# Patient Record
Sex: Male | Born: 1950
Health system: Southern US, Community
[De-identification: ages and names within clinical notes are randomized; demographics above are authoritative.]

## PROBLEM LIST (undated history)

## (undated) DIAGNOSIS — K8020 Calculus of gallbladder without cholecystitis without obstruction: Secondary | ICD-10-CM

## (undated) DIAGNOSIS — I10 Essential (primary) hypertension: Secondary | ICD-10-CM

## (undated) HISTORY — PX: APPENDECTOMY: SHX54

## (undated) HISTORY — PX: NECK SURGERY: SHX720

---

## 2017-07-26 ENCOUNTER — Emergency Department (HOSPITAL_BASED_OUTPATIENT_CLINIC_OR_DEPARTMENT_OTHER): Payer: No Typology Code available for payment source

## 2017-07-26 ENCOUNTER — Encounter (HOSPITAL_BASED_OUTPATIENT_CLINIC_OR_DEPARTMENT_OTHER): Payer: Self-pay | Admitting: Emergency Medicine

## 2017-07-26 ENCOUNTER — Emergency Department (HOSPITAL_BASED_OUTPATIENT_CLINIC_OR_DEPARTMENT_OTHER)
Admission: EM | Admit: 2017-07-26 | Discharge: 2017-07-26 | Disposition: A | Discharge status: Home / Self Care | Payer: No Typology Code available for payment source | Attending: Emergency Medicine | Admitting: Emergency Medicine

## 2017-07-26 ENCOUNTER — Other Ambulatory Visit: Payer: Self-pay

## 2017-07-26 DIAGNOSIS — Z79899 Other long term (current) drug therapy: Secondary | ICD-10-CM | POA: Diagnosis not present

## 2017-07-26 DIAGNOSIS — Y939 Activity, unspecified: Secondary | ICD-10-CM | POA: Diagnosis not present

## 2017-07-26 DIAGNOSIS — Y999 Unspecified external cause status: Secondary | ICD-10-CM | POA: Diagnosis not present

## 2017-07-26 DIAGNOSIS — I10 Essential (primary) hypertension: Secondary | ICD-10-CM | POA: Diagnosis not present

## 2017-07-26 DIAGNOSIS — Z7982 Long term (current) use of aspirin: Secondary | ICD-10-CM | POA: Diagnosis not present

## 2017-07-26 DIAGNOSIS — S2242XA Multiple fractures of ribs, left side, initial encounter for closed fracture: Secondary | ICD-10-CM | POA: Insufficient documentation

## 2017-07-26 DIAGNOSIS — Y92410 Unspecified street and highway as the place of occurrence of the external cause: Secondary | ICD-10-CM | POA: Insufficient documentation

## 2017-07-26 DIAGNOSIS — R1030 Lower abdominal pain, unspecified: Secondary | ICD-10-CM | POA: Diagnosis not present

## 2017-07-26 DIAGNOSIS — S299XXA Unspecified injury of thorax, initial encounter: Secondary | ICD-10-CM | POA: Diagnosis present

## 2017-07-26 HISTORY — DX: Essential (primary) hypertension: I10

## 2017-07-26 LAB — BASIC METABOLIC PANEL
Anion gap: 9 (ref 5–15)
BUN: 23 mg/dL — AB (ref 6–20)
CHLORIDE: 103 mmol/L (ref 101–111)
CO2: 21 mmol/L — ABNORMAL LOW (ref 22–32)
Calcium: 9 mg/dL (ref 8.9–10.3)
Creatinine, Ser: 0.92 mg/dL (ref 0.61–1.24)
GFR calc Af Amer: >60 mL/min (ref >60)
GFR calc non Af Amer: >60 mL/min (ref >60)
GLUCOSE: 139 mg/dL — AB (ref 65–99)
POTASSIUM: 3.9 mmol/L (ref 3.5–5.1)
Sodium: 133 mmol/L — ABNORMAL LOW (ref 135–145)

## 2017-07-26 MED ORDER — HYDROCODONE-ACETAMINOPHEN 5-325 MG PO TABS: 1.0000 | ORAL_TABLET | Freq: Four times a day (QID) | ORAL | 0 refills | Status: DC | PRN

## 2017-07-26 MED ORDER — IOPAMIDOL (ISOVUE-300) INJECTION 61%
100.0000 mL | Freq: Once | INTRAVENOUS | Status: AC | PRN
  Administered 2017-07-26: 100 mL via INTRAVENOUS

## 2017-07-26 MED FILL — HYDROCODON-APAP 5-325: 5-325 | 2 days supply | Qty: 10 | Fill #0

## 2017-07-26 NOTE — Discharge Instructions (Signed)
It was my pleasure taking care of you today!   Use incentive spirometer 2-3 times daily to help prevent pneumonia.   You your ibuprofen home therapy.  Pain medication only as needed for severe pain.  Ice to the affected area can help alleviate pain as well.  Follow-up with your primary care doctor in the next week or 2.  Return to ER for new or worsening symptoms, any additional concerns.
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
## 2017-07-26 NOTE — ED Notes (Signed)
Patient is ambulatory back from x-ray
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
## 2017-07-26 NOTE — ED Notes (Signed)
Patient ambulatory to XR/CT
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
## 2021-07-13 ENCOUNTER — Other Ambulatory Visit: Payer: Self-pay

## 2021-07-13 ENCOUNTER — Emergency Department (HOSPITAL_BASED_OUTPATIENT_CLINIC_OR_DEPARTMENT_OTHER)
Admission: EM | Admit: 2021-07-13 | Discharge: 2021-07-14 | Disposition: A | Discharge status: Home / Self Care | Payer: Medicare HMO | Attending: Emergency Medicine | Admitting: Emergency Medicine

## 2021-07-13 ENCOUNTER — Encounter (HOSPITAL_BASED_OUTPATIENT_CLINIC_OR_DEPARTMENT_OTHER): Payer: Self-pay

## 2021-07-13 DIAGNOSIS — I10 Essential (primary) hypertension: Secondary | ICD-10-CM | POA: Insufficient documentation

## 2021-07-13 DIAGNOSIS — Z7982 Long term (current) use of aspirin: Secondary | ICD-10-CM | POA: Diagnosis not present

## 2021-07-13 DIAGNOSIS — E876 Hypokalemia: Secondary | ICD-10-CM | POA: Diagnosis not present

## 2021-07-13 DIAGNOSIS — E860 Dehydration: Secondary | ICD-10-CM | POA: Diagnosis not present

## 2021-07-13 DIAGNOSIS — A084 Viral intestinal infection, unspecified: Secondary | ICD-10-CM | POA: Diagnosis not present

## 2021-07-13 DIAGNOSIS — Z79899 Other long term (current) drug therapy: Secondary | ICD-10-CM | POA: Insufficient documentation

## 2021-07-13 DIAGNOSIS — Z87891 Personal history of nicotine dependence: Secondary | ICD-10-CM | POA: Diagnosis not present

## 2021-07-13 DIAGNOSIS — R1031 Right lower quadrant pain: Secondary | ICD-10-CM | POA: Diagnosis present

## 2021-07-13 HISTORY — DX: Calculus of gallbladder without cholecystitis without obstruction: K80.20

## 2021-07-13 LAB — URINALYSIS, ROUTINE W REFLEX MICROSCOPIC
Bilirubin Urine: NEGATIVE
Glucose, UA: NEGATIVE mg/dL
Hgb urine dipstick: NEGATIVE
Ketones, ur: NEGATIVE mg/dL
Leukocytes,Ua: NEGATIVE
Nitrite: NEGATIVE
Protein, ur: NEGATIVE mg/dL
Specific Gravity, Urine: >=1.03 (ref 1.005–1.030)
pH: 6 (ref 5.0–8.0)

## 2021-07-13 MED ORDER — SODIUM CHLORIDE 0.9 % IV BOLUS
1000.0000 mL | Freq: Once | INTRAVENOUS | Status: AC
  Administered 2021-07-14: 1000 mL via INTRAVENOUS

## 2021-07-13 NOTE — ED Provider Notes (Signed)
[facility] Provider Note: Darren Dell, MD, FACEP  CSN: [id number] MRN: [id number] ARRIVAL: 07/13/21 at 2140 ROOM: MH12/[facility]12   CHIEF COMPLAINT  Abdominal Pain   HISTORY OF PRESENT ILLNESS  07/13/21 11:51 PM Darren Davis is a 71 y.o. male who developed nausea, vomiting and diarrhea 4 days ago.  He subsequently developed abdominal distention and abdominal pain.  The pain is crampy with sharp stabbing components.  He rates it as an 8 out of 10.  It is somewhat worse with palpation.  His diarrhea and vomiting have improved (only 1 episode each today) but the pain and abdominal distention persist.  He denies blood in his stool but describes the diarrhea as watery.  He is able to drink fluids without any problem but has not had much appetite.   Past Medical History:  Diagnosis Date   Gall stones    Hypertension     Past Surgical History:  Procedure Laterality Date   APPENDECTOMY     NECK SURGERY      No family history on file.  Social History   Tobacco Use   Smoking status: Former    Types: Cigarettes   Smokeless tobacco: Never  Vaping Use   Vaping Use: Never used  Substance Use Topics   Alcohol use: No   Drug use: No    Prior to Admission medications   Medication Sig Start Date End Date Taking? Authorizing Provider  ondansetron (ZOFRAN) 8 MG tablet Take 1 tablet (8 mg total) by mouth every 8 (eight) hours as needed for nausea or vomiting. 07/14/21  Yes Emmagrace Runkel, MD  potassium chloride SA (KLOR-CON M) 20 MEQ tablet Take 1 tablet (20 mEq total) by mouth 2 (two) times daily. 07/14/21  Yes Cecile Guevara, MD  amLODipine (NORVASC) 5 MG tablet Take 5 mg by mouth daily.    [provider]  aspirin EC 81 MG tablet Take 81 mg by mouth daily.    [provider]  losartan (COZAAR) 100 MG tablet Take 100 mg by mouth daily.    [provider]    Allergies Relafen [nabumetone]   REVIEW OF SYSTEMS  Negative except as noted here or in  the History of Present Illness.   PHYSICAL EXAMINATION  Initial Vital Signs Blood pressure (!) 144/85, pulse 93, temperature 98.8 F (37.1 C), temperature source Oral, resp. rate 20, height 6\' 2"  (1.88 m), weight (!) 142.4 kg, SpO2 96 %.  Examination General: Well-developed, well-nourished male in no acute distress; appearance consistent with age of record HENT: normocephalic; atraumatic Eyes: pupils equal, round and reactive to light; extraocular muscles intact; bilateral pseudophakia Neck: supple Heart: regular rate and rhythm Lungs: clear to auscultation bilaterally Abdomen: soft; distended; right lower quadrant tenderness; bowel sounds present Extremities: No deformity; full range of motion; trace edema of lower legs Neurologic: Awake, alert and oriented; motor function intact in all extremities and symmetric; no facial droop Skin: Warm and dry Psychiatric: Normal mood and affect   RESULTS  Summary of this visit's results, reviewed and interpreted by myself:   EKG Interpretation  Date/Time:    Ventricular Rate:    PR Interval:    QRS Duration:   QT Interval:    QTC Calculation:   R Axis:     Text Interpretation:         Laboratory Studies: Results for orders placed or performed during the hospital encounter of 07/13/21 (from the past 24 hour(s))  Urinalysis, Routine w reflex microscopic  Status: None   Collection Time: 07/13/21 10:36 PM  Result Value Ref Range   Color, Urine YELLOW YELLOW   APPearance CLEAR CLEAR   Specific Gravity, Urine >=1.030 1.005 - 1.030   pH 6.0 5.0 - 8.0   Glucose, UA NEGATIVE NEGATIVE mg/dL   Hgb urine dipstick NEGATIVE NEGATIVE   Bilirubin Urine NEGATIVE NEGATIVE   Ketones, ur NEGATIVE NEGATIVE mg/dL   Protein, ur NEGATIVE NEGATIVE mg/dL   Nitrite NEGATIVE NEGATIVE   Leukocytes,Ua NEGATIVE NEGATIVE  Lipase, blood     Status: None   Collection Time: 07/14/21 12:09 AM  Result Value Ref Range   Lipase 26 11 - 51 U/L   Comprehensive metabolic panel     Status: Abnormal   Collection Time: 07/14/21 12:09 AM  Result Value Ref Range   Sodium 130 (L) 135 - 145 mmol/L   Potassium 2.7 (LL) 3.5 - 5.1 mmol/L   Chloride 98 98 - 111 mmol/L   CO2 22 22 - 32 mmol/L   Glucose, Bld 141 (H) 70 - 99 mg/dL   BUN 17 8 - 23 mg/dL   Creatinine, Ser 9.82 0.61 - 1.24 mg/dL   Calcium 8.5 (L) 8.9 - 10.3 mg/dL   Total Protein 7.1 6.5 - 8.1 g/dL   Albumin 3.7 3.5 - 5.0 g/dL   AST 20 15 - 41 U/L   ALT 29 0 - 44 U/L   Alkaline Phosphatase 51 38 - 126 U/L   Total Bilirubin 1.2 0.3 - 1.2 mg/dL   GFR, Estimated >64 >15 mL/min   Anion gap 10 5 - 15  CBC with Differential/Platelet     Status: None   Collection Time: 07/14/21 12:09 AM  Result Value Ref Range   WBC 9.8 4.0 - 10.5 K/uL   RBC 5.27 4.22 - 5.81 MIL/uL   Hemoglobin 14.9 13.0 - 17.0 g/dL   HCT 83.0 94.0 - 76.8 %   MCV 83.5 80.0 - 100.0 fL   MCH 28.3 26.0 - 34.0 pg   MCHC 33.9 30.0 - 36.0 g/dL   RDW 08.8 11.0 - 31.5 %   Platelets 221 150 - 400 K/uL   nRBC 0.0 0.0 - 0.2 %   Neutrophils Relative % 78 %   Neutro Abs 7.7 1.7 - 7.7 K/uL   Lymphocytes Relative 10 %   Lymphs Abs 0.9 0.7 - 4.0 K/uL   Monocytes Relative 11 %   Monocytes Absolute 1.0 0.1 - 1.0 K/uL   Eosinophils Relative 1 %   Eosinophils Absolute 0.1 0.0 - 0.5 K/uL   Basophils Relative 0 %   Basophils Absolute 0.0 0.0 - 0.1 K/uL   Immature Granulocytes 0 %   Abs Immature Granulocytes 0.03 0.00 - 0.07 K/uL   Imaging Studies: CT ABDOMEN PELVIS W CONTRAST  Result Date: 07/14/2021 CLINICAL DATA:  Abdominal pain.  Nausea.  Vomiting and diarrhea. EXAM: CT ABDOMEN AND PELVIS WITH CONTRAST TECHNIQUE: Multidetector CT imaging of the abdomen and pelvis was performed using the standard protocol following bolus administration of intravenous contrast. RADIATION DOSE REDUCTION: This exam was performed according to the departmental dose-optimization program which includes automated exposure control, adjustment of  the mA and/or kV according to patient size and/or use of iterative reconstruction technique. CONTRAST:  OMNIPAQUE IOHEXOL 300 MG/ML  SOLN COMPARISON:  Noncontrast CT 03/30/2020 FINDINGS: Lower chest: Mild hypoventilatory changes. No pleural effusion or focal airspace disease. Hepatobiliary: Mild hepatic steatosis. Vague area of enhancement in the high left lobe, series 2, image 7 and series  5, image 76. multiple gallstones without abnormal gallbladder distention or pericholecystic inflammation. No common bile duct dilatation. No choledocholithiasis. Pancreas: Mild fatty atrophy.  No ductal dilatation or inflammation. Spleen: Normal in size without focal abnormality. Adrenals/Urinary Tract: Normal adrenal glands. No hydronephrosis. No renal calculi or focal renal lesion. There is mild symmetric bilateral perinephric edema that is chronic. Partially distended unremarkable urinary bladder. Stomach/Bowel: Small hiatal hernia. There is fluid distending the stomach. No gastric wall thickening or inflammatory change. The distal small bowel is fluid-filled and mildly dilated, maximal dimension 3.5 cm. There is fecalization of small bowel contents in the central small bowel. History of appendectomy. There is colonic distension, cecum and ascending colon are dilated and fluid-filled. Transverse and descending colon are air-filled. There is a segment of nondistended proximal descending colon that spans approximately 10 cm. There is no associated colonic wall thickening, pericolonic edema, or evidence of colonic mass. Small amount of formed stool in the rectum. There is no pericolonic edema. Vascular/Lymphatic: Moderate aortic atherosclerosis. No aortic aneurysm. The portal and splenic veins are patent. Superior mesenteric vein is patent. No portal venous or mesenteric gas. No abdominopelvic adenopathy. Reproductive: Prostate is unremarkable. Other: No free air, free fluid, or intra-abdominal fluid collection. Fat within  both inguinal canals. Small fat containing umbilical hernia. Musculoskeletal: Facet hypertrophy in the lumbar spine. There are no acute or suspicious osseous abnormalities. IMPRESSION: 1. Subtotal colonic distension with air and fluid. There is segment of proximal descending colon that is nondistended. There are no suspicious features by CT, however colonoscopy could be considered for further assessment. Small bowel is also mildly dilated and fluid-filled with fecalization of contents, suggesting slow transit or ileus. 2. Cholelithiasis without acute cholecystitis. 3. Mild hepatic steatosis. Vague area of enhancement in the high left lobe of the liver is nonspecific, but may be transient hepatic attenuation difference or a hypervascular lesion. 4. Small hiatal hernia. Aortic Atherosclerosis (ICD10-I70.0). Electronically Signed   By: Narda RutherfordMelanie  Sanford M.D.   On: 07/14/2021 01:28    ED COURSE and MDM  Nursing notes, initial and subsequent vitals signs, including pulse oximetry, reviewed and interpreted by myself.  Vitals:   07/14/21 0108 07/14/21 0109 07/14/21 0130 07/14/21 0200  BP: 132/66  126/70 124/78  Pulse: 85 84 84 79  Resp: 18  18 (!) 21  Temp:      TempSrc:      SpO2: 93% 93% 94% 93%  Weight:      Height:       Medications  potassium chloride 10 mEq in 100 mL IVPB (10 mEq Intravenous New Bag/Given 07/14/21 0202)  sodium chloride 0.9 % bolus 1,000 mL (0 mLs Intravenous Stopped 07/14/21 0204)  ondansetron (ZOFRAN) injection 4 mg (4 mg Intravenous Given 07/14/21 0006)  fentaNYL (SUBLIMAZE) injection 100 mcg (100 mcg Intravenous Given 07/14/21 0007)  iohexol (OMNIPAQUE) 300 MG/ML solution 125 mL (125 mLs Intravenous Contrast Given 07/14/21 0051)   1:50 AM Patient is able to drink fluids without vomiting.  His CT scan is concerning for ileus but he states he has been passing profuse gas while in the ED.  At this time I do not believe he needs to be admitted if he is able to been self hydrated and  he continues to pass flatus.  We are administering IV fluids and potassium as he is dehydrated per urine specific gravity and his he is hypokalemic at 2.7.  2:31 AM Patient drinking fluids without difficulty.  Potassium infusing.  Will discharge home after completion  of potassium as I do not believe the patient has an indication for admission at this time.  We can replete his potassium further with oral supplementations and he is drinking fluids and passing flatus.  PROCEDURES  Procedures   ED DIAGNOSES     ICD-10-CM   1. Viral gastroenteritis  A08.4     2. Hypokalemia due to excessive gastrointestinal loss of potassium  E87.6     3. Dehydration  E86.0          Cleave Ternes, Jonny Ruiz, MD 07/14/21 [phone]

## 2021-07-13 NOTE — ED Notes (Signed)
NO LAB RESULTS ON FAX MACHINE-C/O ABD PROTOCOL ORDERS PLACED

## 2021-07-13 NOTE — ED Triage Notes (Signed)
Pt c/o abd pain, n/v/d started 2/19-was seen by PCP yesterday-had labs drawn and was advised MD sent result-info not in epic-pt NAD-steady gait

## 2021-07-14 ENCOUNTER — Emergency Department (HOSPITAL_BASED_OUTPATIENT_CLINIC_OR_DEPARTMENT_OTHER): Payer: Medicare HMO

## 2021-07-14 LAB — CBC WITH DIFFERENTIAL/PLATELET
Abs Immature Granulocytes: 0.03 10*3/uL (ref 0.00–0.07)
Basophils Absolute: 0 10*3/uL (ref 0.0–0.1)
Basophils Relative: 0 %
Eosinophils Absolute: 0.1 10*3/uL (ref 0.0–0.5)
Eosinophils Relative: 1 %
HCT: 44 % (ref 39.0–52.0)
Hemoglobin: 14.9 g/dL (ref 13.0–17.0)
Immature Granulocytes: 0 %
Lymphocytes Relative: 10 %
Lymphs Abs: 0.9 10*3/uL (ref 0.7–4.0)
MCH: 28.3 pg (ref 26.0–34.0)
MCHC: 33.9 g/dL (ref 30.0–36.0)
MCV: 83.5 fL (ref 80.0–100.0)
Monocytes Absolute: 1 10*3/uL (ref 0.1–1.0)
Monocytes Relative: 11 %
Neutro Abs: 7.7 10*3/uL (ref 1.7–7.7)
Neutrophils Relative %: 78 %
Platelets: 221 10*3/uL (ref 150–400)
RBC: 5.27 MIL/uL (ref 4.22–5.81)
RDW: 13.9 % (ref 11.5–15.5)
WBC: 9.8 10*3/uL (ref 4.0–10.5)
nRBC: 0 % (ref 0.0–0.2)

## 2021-07-14 LAB — COMPREHENSIVE METABOLIC PANEL
ALT: 29 U/L (ref 0–44)
AST: 20 U/L (ref 15–41)
Albumin: 3.7 g/dL (ref 3.5–5.0)
Alkaline Phosphatase: 51 U/L (ref 38–126)
Anion gap: 10 (ref 5–15)
BUN: 17 mg/dL (ref 8–23)
CO2: 22 mmol/L (ref 22–32)
Calcium: 8.5 mg/dL — ABNORMAL LOW (ref 8.9–10.3)
Chloride: 98 mmol/L (ref 98–111)
Creatinine, Ser: 1.1 mg/dL (ref 0.61–1.24)
GFR, Estimated: >60 mL/min (ref >60)
Glucose, Bld: 141 mg/dL — ABNORMAL HIGH (ref 70–99)
Potassium: 2.7 mmol/L — CL (ref 3.5–5.1)
Sodium: 130 mmol/L — ABNORMAL LOW (ref 135–145)
Total Bilirubin: 1.2 mg/dL (ref 0.3–1.2)
Total Protein: 7.1 g/dL (ref 6.5–8.1)

## 2021-07-14 LAB — LIPASE, BLOOD: Lipase: 26 U/L (ref 11–51)

## 2021-07-14 MED ORDER — POTASSIUM CHLORIDE 10 MEQ/100ML IV SOLN
10.0000 meq | INTRAVENOUS | Status: AC
  Administered 2021-07-14 (×4): 10 meq via INTRAVENOUS
  Filled 2021-07-14 (×4): qty 100

## 2021-07-14 MED ORDER — ONDANSETRON HCL 8 MG PO TABS: 8.0000 mg | ORAL_TABLET | Freq: Three times a day (TID) | ORAL | 0 refills | Status: AC | PRN

## 2021-07-14 MED ORDER — ONDANSETRON HCL 4 MG/2ML IJ SOLN
4.0000 mg | Freq: Once | INTRAMUSCULAR | Status: AC
  Administered 2021-07-14: 4 mg via INTRAVENOUS
  Filled 2021-07-14: qty 2

## 2021-07-14 MED ORDER — IOHEXOL 300 MG/ML  SOLN
125.0000 mL | Freq: Once | INTRAMUSCULAR | Status: AC | PRN
  Administered 2021-07-14: 125 mL via INTRAVENOUS

## 2021-07-14 MED ORDER — FENTANYL CITRATE PF 50 MCG/ML IJ SOSY
100.0000 ug | PREFILLED_SYRINGE | Freq: Once | INTRAMUSCULAR | Status: AC
  Administered 2021-07-14: 100 ug via INTRAVENOUS
  Filled 2021-07-14: qty 2

## 2021-07-14 MED ORDER — POTASSIUM CHLORIDE CRYS ER 20 MEQ PO TBCR: 20.0000 meq | EXTENDED_RELEASE_TABLET | Freq: Two times a day (BID) | ORAL | 0 refills | Status: AC

## 2021-07-14 NOTE — ED Notes (Signed)
Patient transported to CT 

## 2023-08-24 IMAGING — CT CT ABD-PELV W/ CM
2 of 5 series · 15 of 46 positions shown, 17 images · IV contrast (Omnipaque)
Comparison: Noncontrast CT 03/30/2020

CLINICAL DATA: Abdominal pain.  Nausea.  Vomiting and diarrhea.

EXAM:
CT ABDOMEN AND PELVIS WITH CONTRAST
TECHNIQUE: Multidetector CT imaging of the abdomen and pelvis was performed
using the standard protocol following bolus administration of
intravenous contrast.

[Series 2: axial st · axial · 0.98mm/px · z∈[+846,+1291]mm · 12 of 101 slices shown, 14 images]
[im 6/101  soft-tissue]
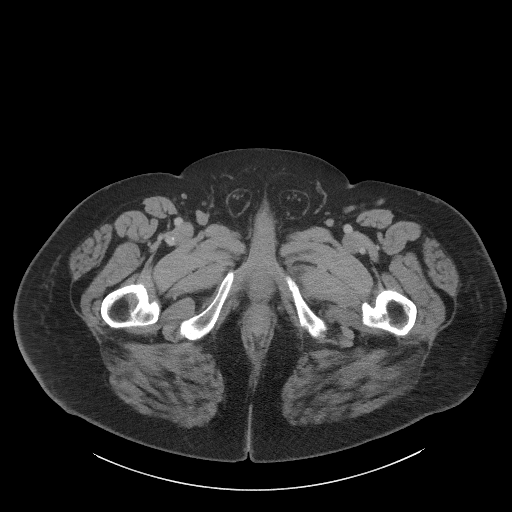
[im 6/101  bone]
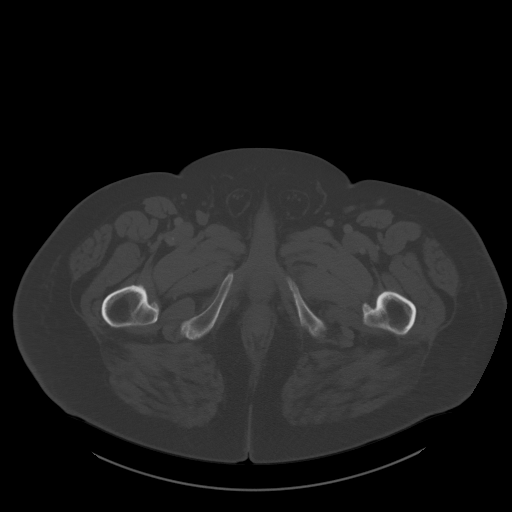
[im 16/101  soft-tissue]
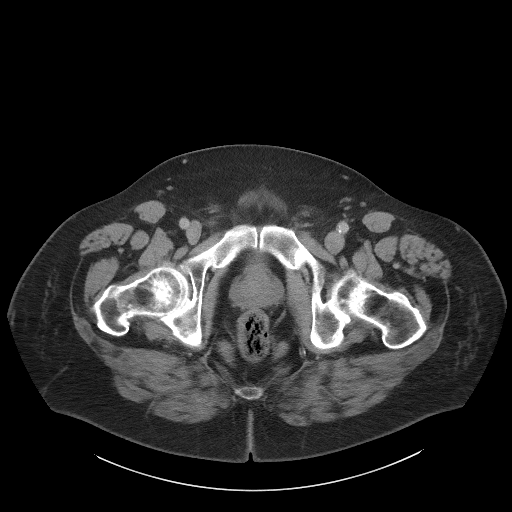
[im 22/101  soft-tissue]
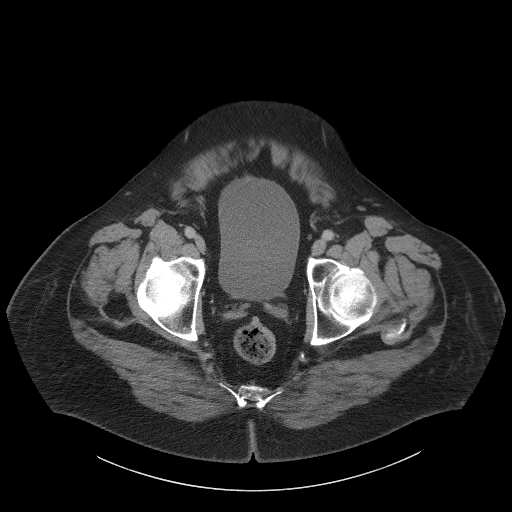
[im 32/101  soft-tissue]
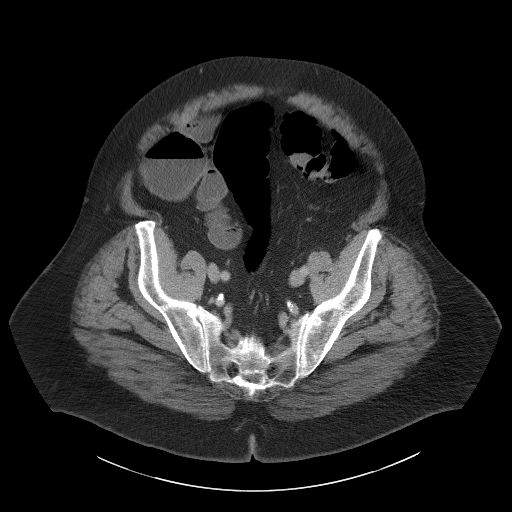
[im 37/101  soft-tissue]
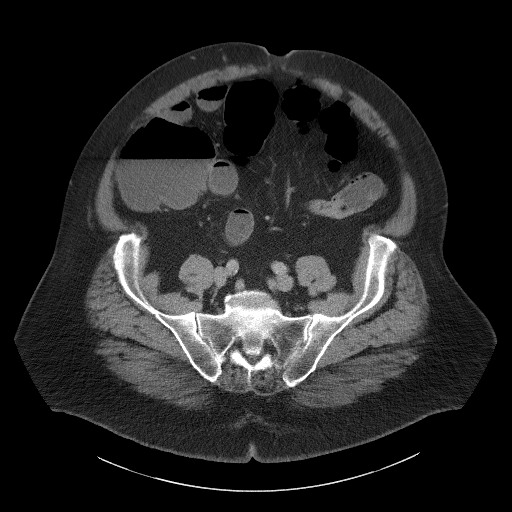
[im 48/101  soft-tissue]
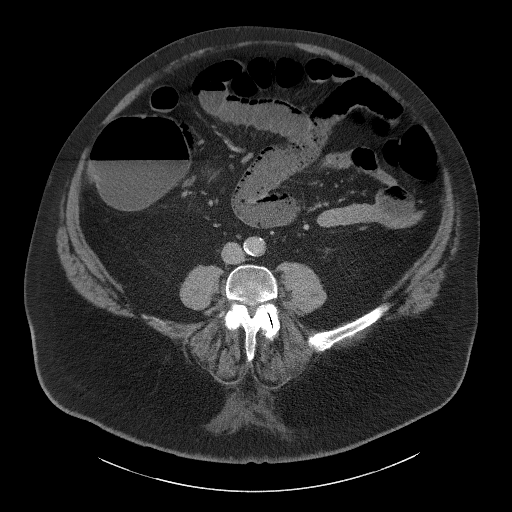
[im 53/101  soft-tissue]
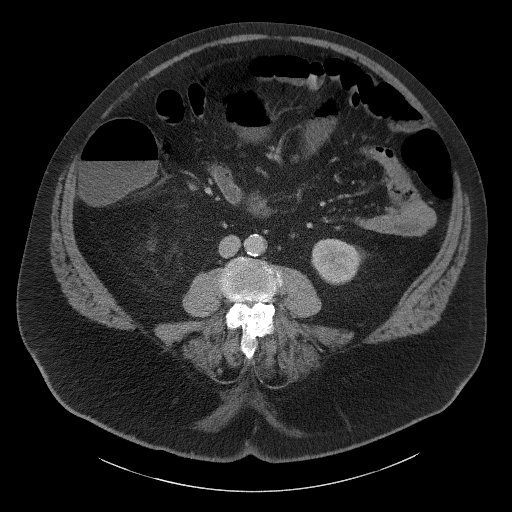
[im 64/101  soft-tissue]
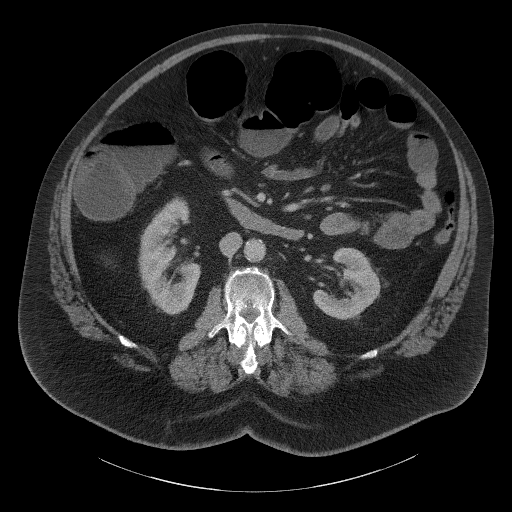
[im 69/101  soft-tissue]
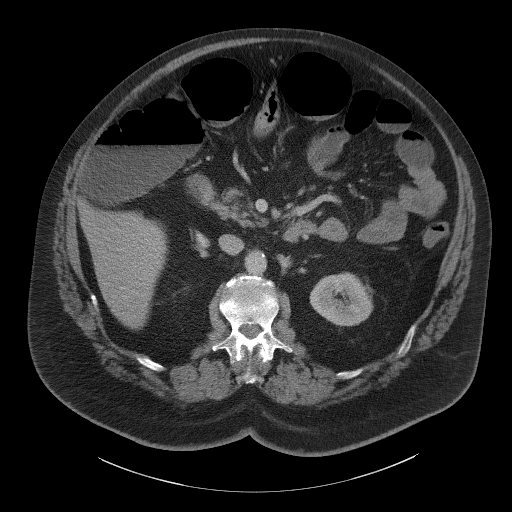
[im 69/101  bone]
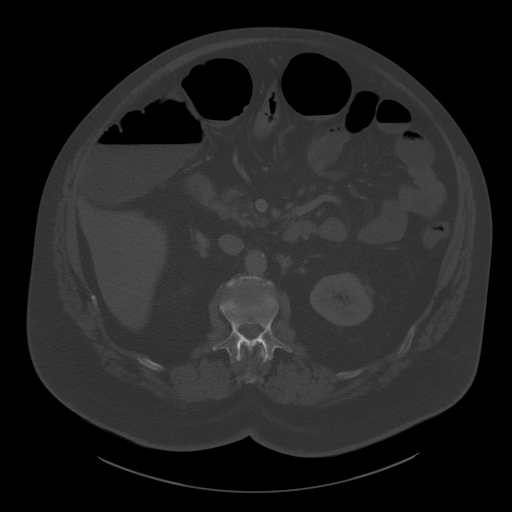
[im 79/101  soft-tissue]
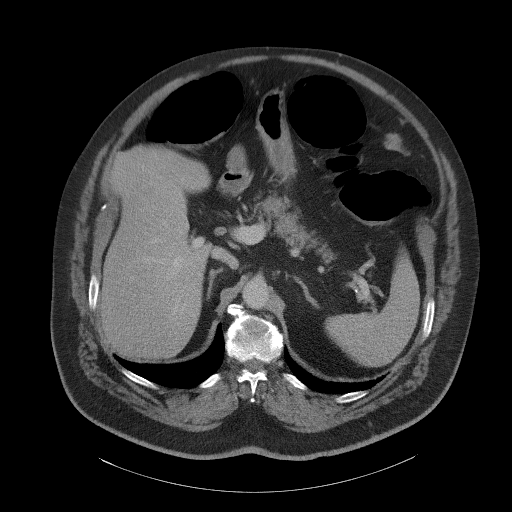
[im 85/101  soft-tissue]
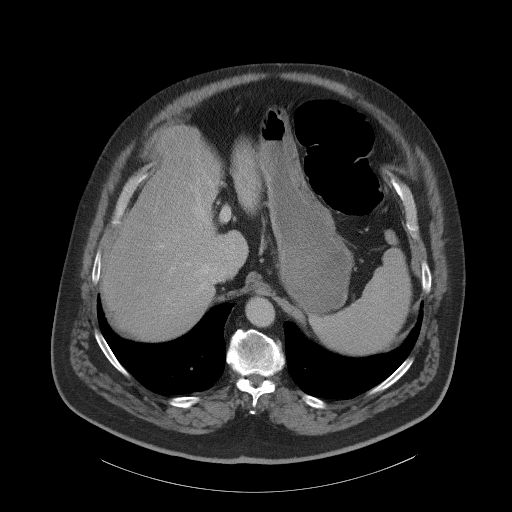
[im 95/101  soft-tissue]
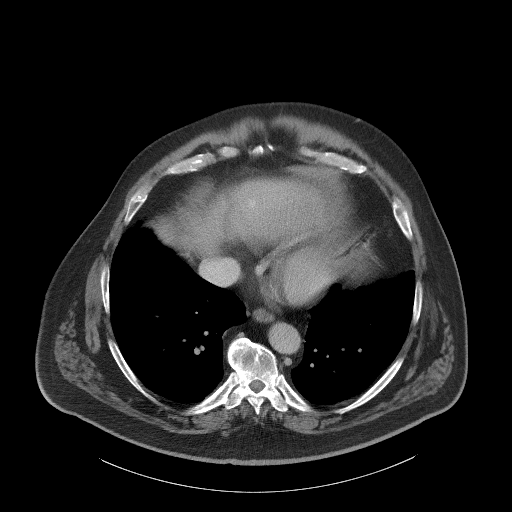

[Series 5: coronal st · coronal · 0.92mm/px · 3 of 137 slices shown]
[im 46/137  soft-tissue]
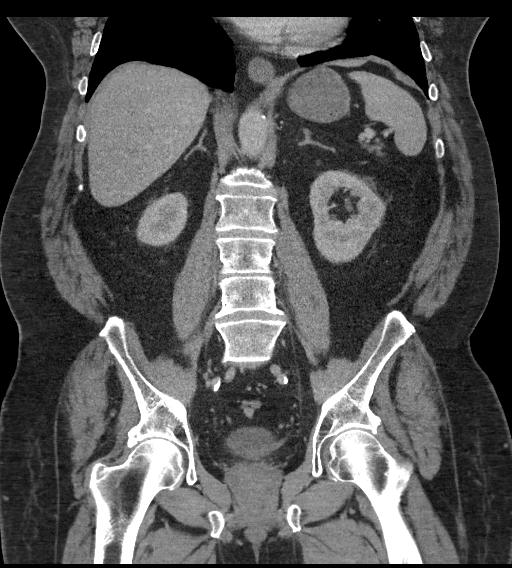
[im 61/137  soft-tissue]
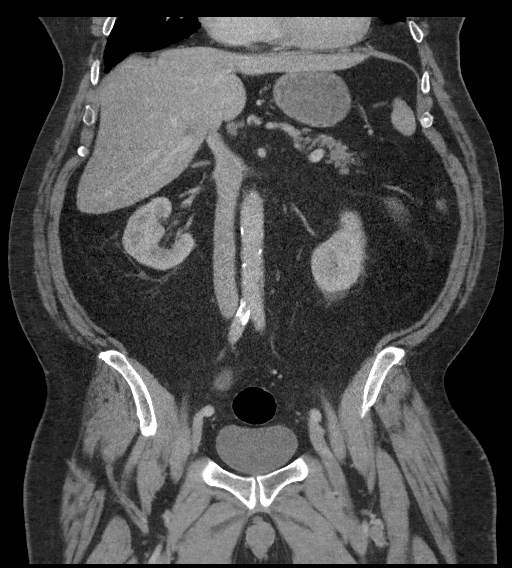
[im 76/137  soft-tissue]
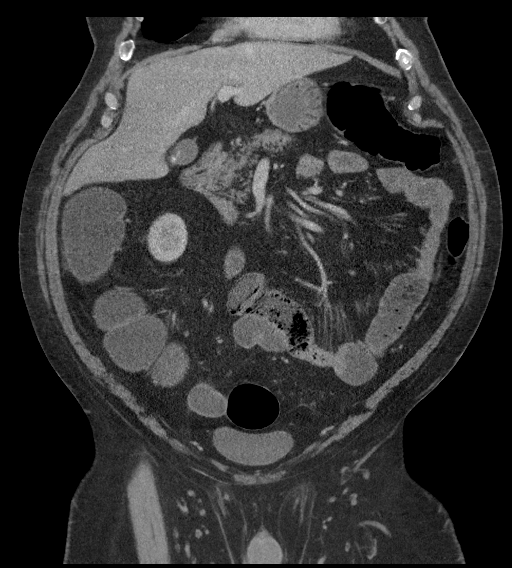

[15 of 46 positions shown; findings below may reference images not displayed]

RADIATION DOSE REDUCTION: This exam was performed according to the
departmental dose-optimization program which includes automated
exposure control, adjustment of the mA and/or kV according to
patient size and/or use of iterative reconstruction technique.

CONTRAST:  125mL OMNIPAQUE IOHEXOL 300 MG/ML  SOLN
FINDINGS: Lower chest: Mild hypoventilatory changes. No pleural effusion or
focal airspace disease.

Hepatobiliary: Mild hepatic steatosis. Vague area of enhancement in
the high left lobe, series 2, image 7 and series 5, image 76.
multiple gallstones without abnormal gallbladder distention or
pericholecystic inflammation. No common bile duct dilatation. No
choledocholithiasis.

Pancreas: Mild fatty atrophy.  No ductal dilatation or inflammation.

Spleen: Normal in size without focal abnormality.

Adrenals/Urinary Tract: Normal adrenal glands. No hydronephrosis. No
renal calculi or focal renal lesion. There is mild symmetric
bilateral perinephric edema that is chronic. Partially distended
unremarkable urinary bladder.

Stomach/Bowel: Small hiatal hernia. There is fluid distending the
stomach. No gastric wall thickening or inflammatory change. The
distal small bowel is fluid-filled and mildly dilated, maximal
dimension 3.5 cm. There is fecalization of small bowel contents in
the central small bowel. History of appendectomy. There is colonic
distension, cecum and ascending colon are dilated and fluid-filled.
Transverse and descending colon are air-filled. There is a segment
of nondistended proximal descending colon that spans approximately
10 cm. There is no associated colonic wall thickening, pericolonic
edema, or evidence of colonic mass. Small amount of formed stool in
the rectum. There is no pericolonic edema.

Vascular/Lymphatic: Moderate aortic atherosclerosis. No aortic
aneurysm. The portal and splenic veins are patent. Superior
mesenteric vein is patent. No portal venous or mesenteric gas. No
abdominopelvic adenopathy.

Reproductive: Prostate is unremarkable.

Other: No free air, free fluid, or intra-abdominal fluid collection.
Fat within both inguinal canals. Small fat containing umbilical
hernia.

Musculoskeletal: Facet hypertrophy in the lumbar spine. There are no
acute or suspicious osseous abnormalities.
IMPRESSION: 1. Subtotal colonic distension with air and fluid. There is segment
of proximal descending colon that is nondistended. There are no
suspicious features by CT, however colonoscopy could be considered
for further assessment. Small bowel is also mildly dilated and
fluid-filled with fecalization of contents, suggesting slow transit
or ileus.
2. Cholelithiasis without acute cholecystitis.
3. Mild hepatic steatosis. Vague area of enhancement in the high
left lobe of the liver is nonspecific, but may be transient hepatic
attenuation difference or a hypervascular lesion.
4. Small hiatal hernia.

Aortic Atherosclerosis ([id number]).
